# Patient Record
Sex: Male | Born: 1980 | Race: Asian | Hispanic: No | Marital: Single | Smoking: Never smoker
Health system: Southern US, Community
[De-identification: ages and names within clinical notes are randomized; demographics above are authoritative.]

---

## 2015-07-24 ENCOUNTER — Encounter (HOSPITAL_COMMUNITY): Payer: Self-pay | Admitting: *Deleted

## 2015-07-24 ENCOUNTER — Emergency Department (HOSPITAL_COMMUNITY): Payer: Self-pay

## 2015-07-24 ENCOUNTER — Emergency Department (HOSPITAL_COMMUNITY)
Admission: EM | Admit: 2015-07-24 | Discharge: 2015-07-24 | Disposition: A | Discharge status: Home / Self Care | Payer: Self-pay | Attending: Physician Assistant | Admitting: Physician Assistant

## 2015-07-24 DIAGNOSIS — N200 Calculus of kidney: Secondary | ICD-10-CM | POA: Insufficient documentation

## 2015-07-24 DIAGNOSIS — R109 Unspecified abdominal pain: Secondary | ICD-10-CM

## 2015-07-24 LAB — URINE MICROSCOPIC-ADD ON

## 2015-07-24 LAB — URINALYSIS, ROUTINE W REFLEX MICROSCOPIC
BILIRUBIN URINE: NEGATIVE
Glucose, UA: NEGATIVE mg/dL
Ketones, ur: NEGATIVE mg/dL
Leukocytes, UA: NEGATIVE
NITRITE: NEGATIVE
PROTEIN: NEGATIVE mg/dL
SPECIFIC GRAVITY, URINE: 1.022 (ref 1.005–1.030)
pH: 7.5 (ref 5.0–8.0)

## 2015-07-24 MED ORDER — NAPROXEN 500 MG PO TABS: 500.0000 mg | ORAL_TABLET | Freq: Two times a day (BID) | ORAL | Status: AC

## 2015-07-24 MED ORDER — HYDROMORPHONE HCL 1 MG/ML IJ SOLN
1.0000 mg | Freq: Once | INTRAMUSCULAR | Status: AC
  Administered 2015-07-24: 1 mg via INTRAVENOUS
  Filled 2015-07-24: qty 1

## 2015-07-24 MED ORDER — ONDANSETRON HCL 4 MG/2ML IJ SOLN
4.0000 mg | Freq: Once | INTRAMUSCULAR | Status: AC | PRN
  Administered 2015-07-24: 4 mg via INTRAVENOUS
  Filled 2015-07-24: qty 2

## 2015-07-24 MED ORDER — OXYCODONE-ACETAMINOPHEN 5-325 MG PO TABS: 2.0000 | ORAL_TABLET | ORAL | Status: AC | PRN

## 2015-07-24 MED ORDER — TAMSULOSIN HCL 0.4 MG PO CAPS: 0.4000 mg | ORAL_CAPSULE | Freq: Every day | ORAL | Status: AC

## 2015-07-24 MED ORDER — ONDANSETRON HCL 4 MG PO TABS: 4.0000 mg | ORAL_TABLET | Freq: Three times a day (TID) | ORAL | Status: AC | PRN

## 2015-07-24 NOTE — ED Notes (Addendum)
PA at bedside.

## 2015-07-24 NOTE — ED Provider Notes (Signed)
CSN: [id number]     Arrival date & time 07/24/15  1511 History   First MD Initiated Contact with Patient 07/24/15 1938     Chief Complaint  Patient presents with  . Flank Pain     (Consider location/radiation/quality/duration/timing/severity/associated sxs/prior Treatment) Patient is a 34 y.o. male presenting with flank pain. The history is provided by the patient and medical records. No language interpreter was used.  Flank Pain Associated symptoms include nausea and vomiting. Pertinent negatives include no abdominal pain, arthralgias, congestion, coughing, headaches, myalgias, neck pain, rash, sore throat or weakness.  Fredie Majano is a 34 y.o. male  who presents to the Emergency Department complaining of intermittent, sharp right back/flank pain beginning last night. Patient denies radiation of pain. History of two prior stones both of which passed on its own. Associated symptoms include dysuria, n/v. Denies fever.   History reviewed. No pertinent past medical history. History reviewed. No pertinent past surgical history. No family history on file. Social History  Substance Use Topics  . Smoking status: Never Smoker   . Smokeless tobacco: None  . Alcohol Use: Yes    Review of Systems  Constitutional: Negative.   HENT: Negative for congestion, rhinorrhea and sore throat.   Eyes: Negative for visual disturbance.  Respiratory: Negative for cough, shortness of breath and wheezing.   Cardiovascular: Negative.   Gastrointestinal: Positive for nausea and vomiting. Negative for abdominal pain, diarrhea and constipation.  Genitourinary: Positive for dysuria and flank pain.  Musculoskeletal: Positive for back pain. Negative for myalgias, arthralgias and neck pain.  Skin: Negative for rash.  Neurological: Negative for dizziness, weakness and headaches.      Allergies  Review of patient's allergies indicates no known allergies.  Home Medications   Prior to Admission medications   Not  on File   BP 160/100 mmHg  Pulse 76  Temp(Src) 97.7 F (36.5 C) (Oral)  Resp 18  SpO2 100% Physical Exam  Constitutional: He is oriented to person, place, and time. He appears well-developed and well-nourished.  Alert and in no acute distress  HENT:  Head: Normocephalic and atraumatic.  Cardiovascular: Normal rate, regular rhythm and normal heart sounds.  Exam reveals no gallop and no friction rub.   No murmur heard. Pulmonary/Chest: Effort normal and breath sounds normal. No respiratory distress. He has no wheezes. He has no rales.  Abdominal: He exhibits no mass. There is no rebound and no guarding.  Abdomen soft, non-tender, non-distended Bowel sounds positive in all four quadrants  Musculoskeletal: He exhibits no edema.       Arms: Neurological: He is alert and oriented to person, place, and time.  Skin: Skin is warm and dry. No rash noted.  Psychiatric: He has a normal mood and affect. His behavior is normal. Judgment and thought content normal.  Nursing note and vitals reviewed.   ED Course  Procedures (including critical care time) Labs Review Labs Reviewed  URINALYSIS, ROUTINE W REFLEX MICROSCOPIC (NOT AT [facility]) - Abnormal; Notable for the following:    APPearance TURBID (*)    Hgb urine dipstick MODERATE (*)    All other components within normal limits  URINE MICROSCOPIC-ADD ON - Abnormal; Notable for the following:    Squamous Epithelial / LPF 0-5 (*)    Bacteria, UA RARE (*)    All other components within normal limits    Imaging Review Ct Renal Stone Study  07/24/2015  CLINICAL DATA:  34 year old male with right flank pain. History of renal stones. EXAM:  CT ABDOMEN AND PELVIS WITHOUT CONTRAST TECHNIQUE: Multidetector CT imaging of the abdomen and pelvis was performed following the standard protocol without IV contrast. COMPARISON:  None. FINDINGS: Evaluation of this exam is limited in the absence of intravenous contrast. The visualized lung bases are clear. No  intra-abdominal free air or free fluid. The liver, gallbladder, pancreas, spleen, and adrenal glands appear unremarkable. There is a 3 mm nonobstructing left renal upper pole calculus. There is no hydronephrosis on the left. There is a 6 mm distal right ureteral stone with mild right hydronephrosis. There is mild right perinephric stranding which may be reactive or represent rupture of the renal calyx. No perinephric fluid collection identified. The urinary bladder, prostate and seminal vesicles appear unremarkable. There is no evidence of bowel obstruction or inflammation. Normal appendix. The abdominal aorta and IVC appear grossly unremarkable on this noncontrast study. No portal venous gas identified. There is no adenopathy. The abdominal wall soft tissues appear unremarkable. The osseous structures are intact. IMPRESSION: A 6 mm distal right ureteral stone with mild right hydronephrosis. A 3 mm nonobstructing left renal upper pole calculus. There is no hydronephrosis on the left. Electronically Signed   By: Elgie CollardArash  Radparvar M.D.   On: 07/24/2015 20:41   I have personally reviewed and evaluated these images and lab results as part of my medical decision-making.   EKG Interpretation None      MDM   Final diagnoses:  Right flank pain  Nephrolithiasis   Fransico SettersYun Filosa presents with right flank pain c/w kidney stone  Labs: UA with moderate hgb, 0-5 white cells, rare bacteria, negative nitrite, negative leuks  Imaging: CT renal with 6mm distal right ureteral stone with mild hydro and 3 mm nonobstructing left renal upper pole stone  Consults: urology who recommends home treatment with pain control, nausea control, and flomax with return precautions   Therapeutics: zofran, dilaudid   A&P: Nephrolithiasis   - Per urology, pain control, zofran, flomax  - Strict return precautions  - Urology follow up  Patient discussed with Dr. Corlis LeakMackuen who agrees with treatment plan.   [facility] Orthopaedic Surgery LLCJaime Pilcher Ward,  PA-C 07/24/15 2133  Courteney Randall AnLyn Mackuen, MD 07/24/15 [phone]

## 2015-07-24 NOTE — ED Notes (Signed)
Pt complains of right flank pain, dysuria and emesis since last night. Pt states he has hx of kidney stones.

## 2015-07-24 NOTE — ED Notes (Signed)
Pt transported to CT ?

## 2015-07-24 NOTE — Discharge Instructions (Signed)
1. Medications: zofran as needed for nausea, percocet as needed for pain - This can make you very drowsy - please do not drink or drive on this medication, flomax to help stone pass, and naproxen will aide in pain relief as well, continue usual home medications 2. Treatment: rest, drink plenty of fluids 3. Follow Up: Please follow up with urology in 2-3 days or earlier if symptoms do not improve for discussion of your diagnoses and further evaluation after today's visit Please return to the ER for any new or worsening symptoms, any additional concerns.

## 2016-03-21 IMAGING — CT CT RENAL STONE PROTOCOL
2 of 3 series · 16 of 43 positions shown, 18 images · non-contrast
Comparison: None.

CLINICAL DATA: 34-year-old male with right flank pain. History of
renal stones.

EXAM:
CT ABDOMEN AND PELVIS WITHOUT CONTRAST
TECHNIQUE: Multidetector CT imaging of the abdomen and pelvis was performed
following the standard protocol without IV contrast.

[Series 3: coronal · coronal · 0.70mm/px · 3 of 97 slices shown]
[im 33/97  soft-tissue]
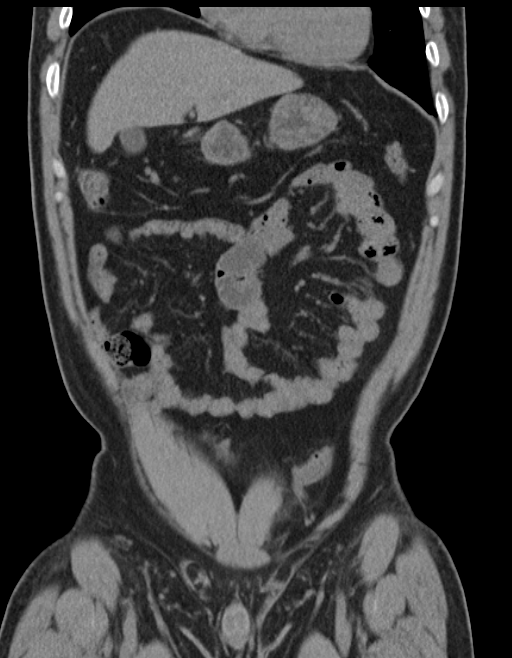
[im 43/97  soft-tissue]
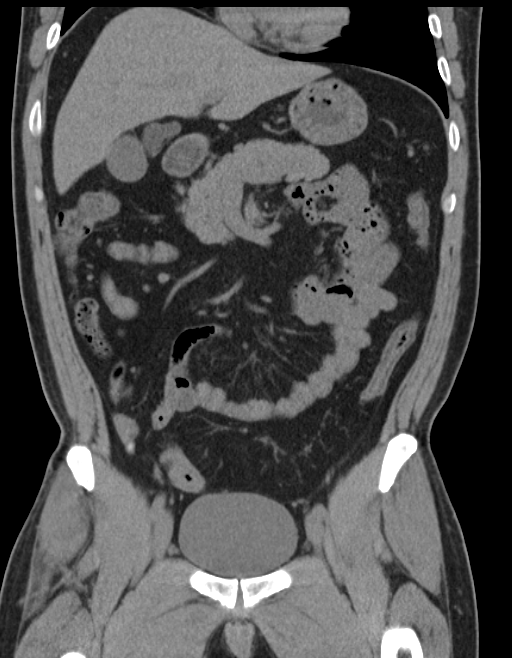
[im 54/97  soft-tissue]
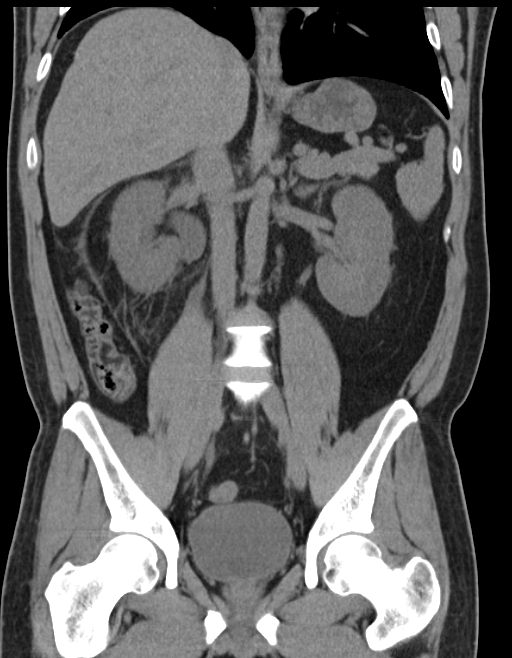

[Series 6: lung · axial · 0.75mm/px · z∈[+1263,+1383]mm · 13 of 29 slices shown, 15 images]
[im 3/29  soft-tissue]
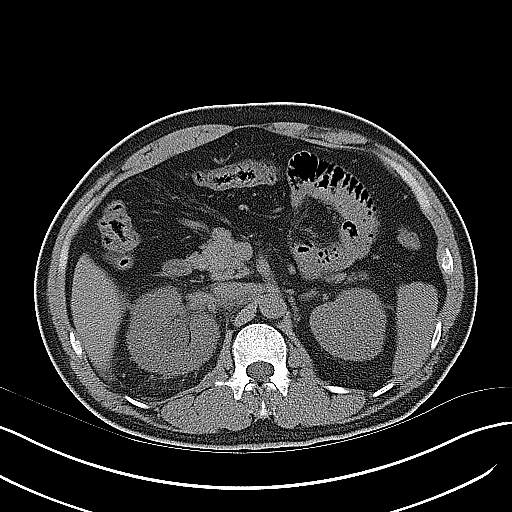
[im 3/29  bone]
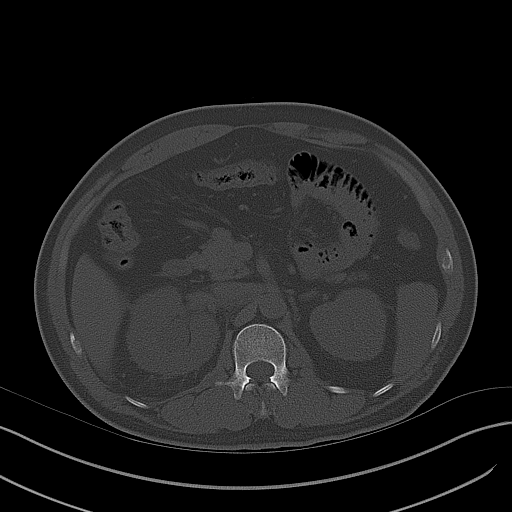
[im 5/29  soft-tissue]
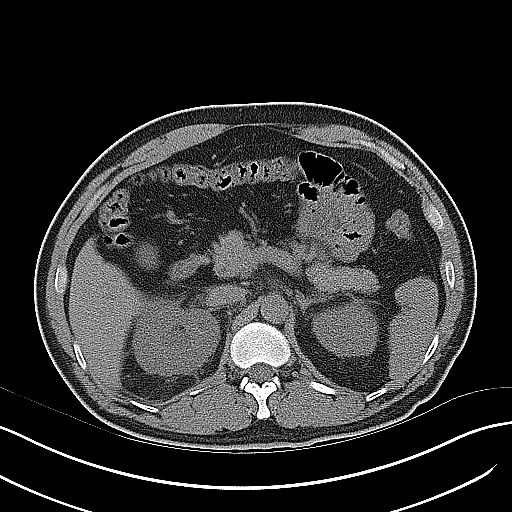
[im 7/29  soft-tissue]
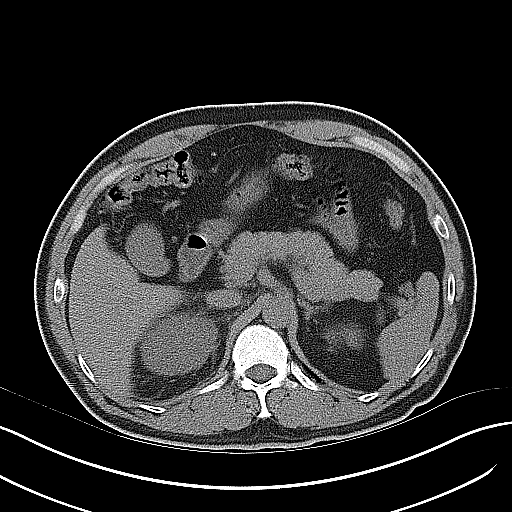
[im 9/29  soft-tissue]
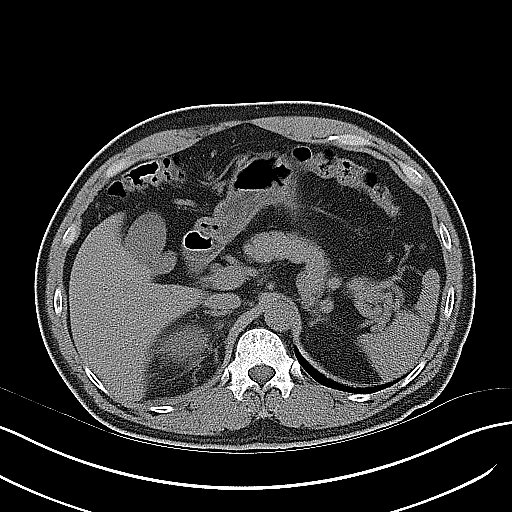
[im 11/29  soft-tissue]
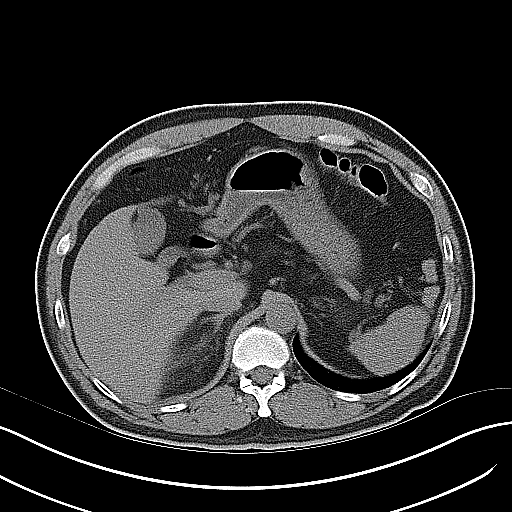
[im 13/29  soft-tissue]
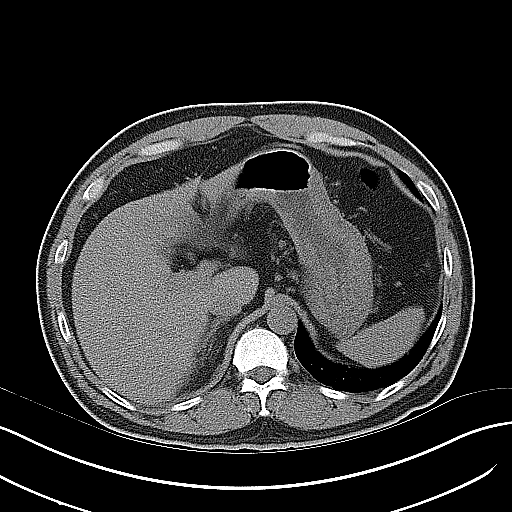
[im 15/29  soft-tissue]
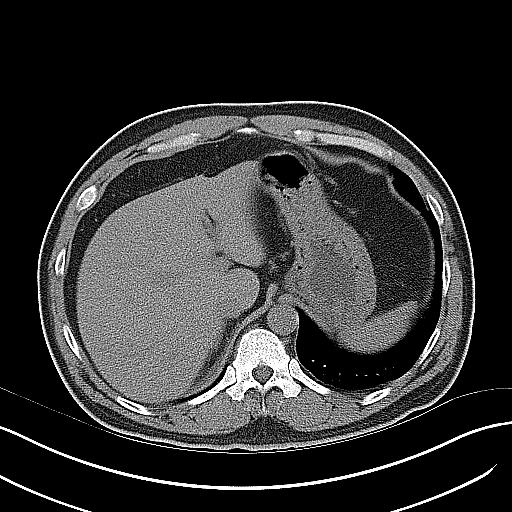
[im 17/29  soft-tissue]
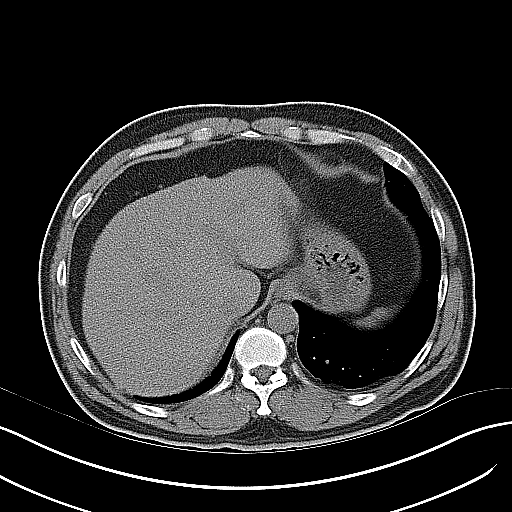
[im 19/29  soft-tissue]
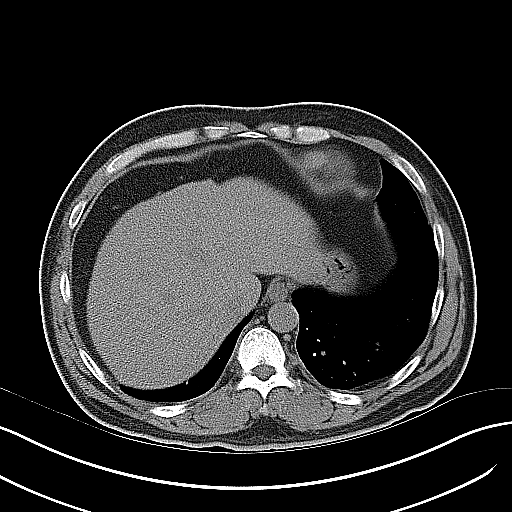
[im 19/29  bone]
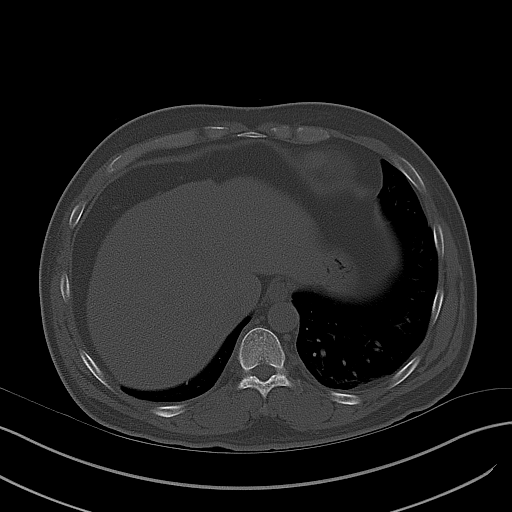
[im 21/29  soft-tissue]
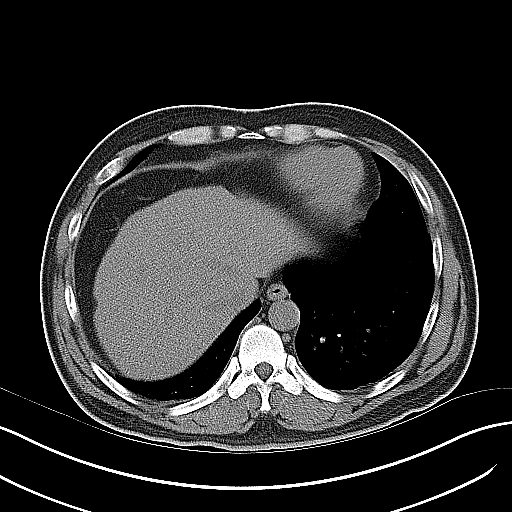
[im 23/29  soft-tissue]
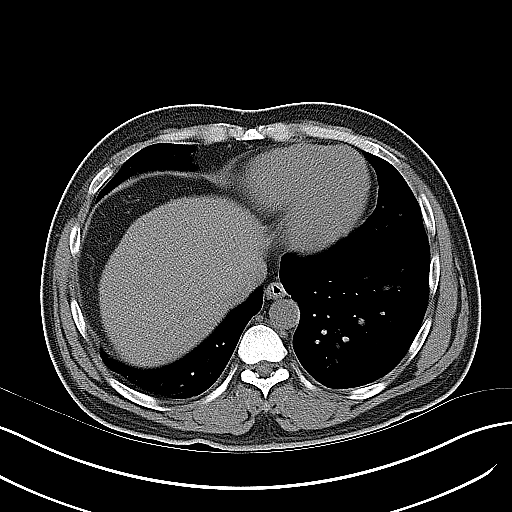
[im 25/29  soft-tissue]
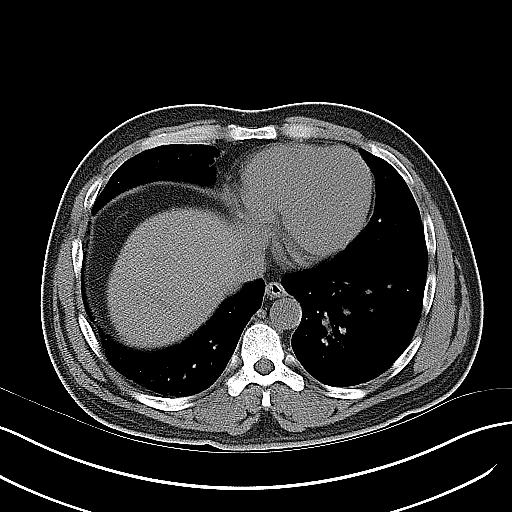
[im 27/29  soft-tissue]
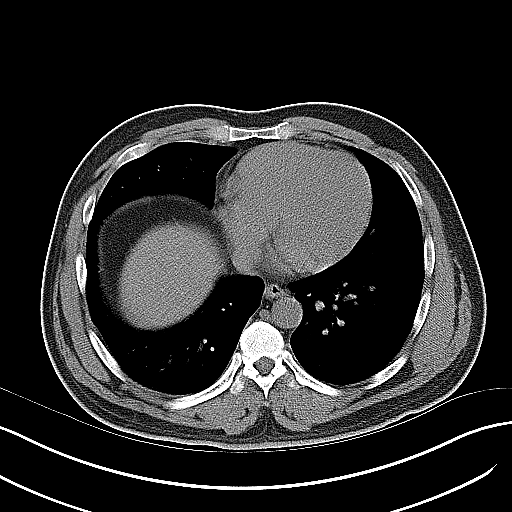

[16 of 43 positions shown; findings below may reference images not displayed]

FINDINGS: Evaluation of this exam is limited in the absence of intravenous
contrast.

The visualized lung bases are clear. No intra-abdominal free air or
free fluid.

The liver, gallbladder, pancreas, spleen, and adrenal glands appear
unremarkable. There is a 3 mm nonobstructing left renal upper pole
calculus. There is no hydronephrosis on the left. There is a 6 mm
distal right ureteral stone with mild right hydronephrosis. There is
mild right perinephric stranding which may be reactive or represent
rupture of the renal calyx. No perinephric fluid collection
identified. The urinary bladder, prostate and seminal vesicles
appear unremarkable.

There is no evidence of bowel obstruction or inflammation. Normal
appendix.

The abdominal aorta and IVC appear grossly unremarkable on this
noncontrast study. No portal venous gas identified. There is no
adenopathy. The abdominal wall soft tissues appear unremarkable. The
osseous structures are intact.
IMPRESSION: A 6 mm distal right ureteral stone with mild right hydronephrosis.

A 3 mm nonobstructing left renal upper pole calculus. There is no
hydronephrosis on the left.
